# Patient Record
Sex: Male | Born: 1960 | Race: White | Hispanic: No | Marital: Married
Health system: Southern US, Community
[De-identification: ages and names within clinical notes are randomized; demographics above are authoritative.]

## PROBLEM LIST (undated history)

## (undated) DIAGNOSIS — I10 Essential (primary) hypertension: Secondary | ICD-10-CM

---

## 2010-08-06 ENCOUNTER — Emergency Department (HOSPITAL_COMMUNITY)
Admission: EM | Admit: 2010-08-06 | Discharge: 2010-08-06 | Discharge status: Against Medical Advice | Payer: BC Managed Care – PPO | Attending: Emergency Medicine | Admitting: Emergency Medicine

## 2010-08-06 ENCOUNTER — Encounter (HOSPITAL_COMMUNITY): Payer: Self-pay | Admitting: Radiology

## 2010-08-06 ENCOUNTER — Emergency Department (HOSPITAL_COMMUNITY): Payer: BC Managed Care – PPO

## 2010-08-06 DIAGNOSIS — R0989 Other specified symptoms and signs involving the circulatory and respiratory systems: Secondary | ICD-10-CM | POA: Insufficient documentation

## 2010-08-06 DIAGNOSIS — R0609 Other forms of dyspnea: Secondary | ICD-10-CM | POA: Insufficient documentation

## 2010-08-06 DIAGNOSIS — I498 Other specified cardiac arrhythmias: Secondary | ICD-10-CM | POA: Insufficient documentation

## 2010-08-06 DIAGNOSIS — R51 Headache: Secondary | ICD-10-CM | POA: Insufficient documentation

## 2010-08-06 DIAGNOSIS — R0602 Shortness of breath: Secondary | ICD-10-CM | POA: Insufficient documentation

## 2010-08-06 DIAGNOSIS — R079 Chest pain, unspecified: Secondary | ICD-10-CM | POA: Insufficient documentation

## 2010-08-06 DIAGNOSIS — R42 Dizziness and giddiness: Secondary | ICD-10-CM | POA: Insufficient documentation

## 2010-08-06 HISTORY — DX: Essential (primary) hypertension: I10

## 2010-08-06 LAB — CBC
HCT: 44.1 % (ref 39.0–52.0)
Hemoglobin: 15.5 g/dL (ref 13.0–17.0)
MCHC: 35.1 g/dL (ref 30.0–36.0)
MCV: 93 fL (ref 78.0–100.0)
RDW: 12 % (ref 11.5–15.5)

## 2010-08-06 LAB — COMPREHENSIVE METABOLIC PANEL
ALT: 40 U/L (ref 0–53)
AST: 21 U/L (ref 0–37)
Alkaline Phosphatase: 65 U/L (ref 39–117)
CO2: 30 mEq/L (ref 19–32)
Calcium: 9.1 mg/dL (ref 8.4–10.5)
GFR calc Af Amer: >60 mL/min (ref >60)
Glucose, Bld: 83 mg/dL (ref 70–99)
Potassium: 4 mEq/L (ref 3.5–5.1)
Sodium: 141 mEq/L (ref 135–145)
Total Protein: 7.5 g/dL (ref 6.0–8.3)

## 2010-08-06 LAB — POCT CARDIAC MARKERS
CKMB, poc: <1 ng/mL — ABNORMAL LOW (ref 1.0–8.0)
CKMB, poc: <1 ng/mL — ABNORMAL LOW (ref 1.0–8.0)
CKMB, poc: <1 ng/mL — ABNORMAL LOW (ref 1.0–8.0)
Myoglobin, poc: 51.7 ng/mL (ref 12–200)
Troponin i, poc: <0.05 ng/mL (ref 0.00–0.09)
Troponin i, poc: <0.05 ng/mL (ref 0.00–0.09)

## 2010-08-06 LAB — DIFFERENTIAL
Eosinophils Relative: 7 % — ABNORMAL HIGH (ref 0–5)
Lymphocytes Relative: 28 % (ref 12–46)
Lymphs Abs: 1.4 10*3/uL (ref 0.7–4.0)
Monocytes Absolute: 0.5 10*3/uL (ref 0.1–1.0)
Neutro Abs: 2.9 10*3/uL (ref 1.7–7.7)

## 2010-08-06 LAB — D-DIMER, QUANTITATIVE: D-Dimer, Quant: 1.54 ug/mL-FEU — ABNORMAL HIGH (ref 0.00–0.48)

## 2010-08-06 MED ORDER — IOHEXOL 300 MG/ML  SOLN
80.0000 mL | Freq: Once | INTRAMUSCULAR | Status: AC | PRN
  Administered 2010-08-06: 80 mL via INTRAVENOUS

## 2012-06-29 IMAGING — CR DG CHEST 2V
2 series · 2 of 2 positions shown · non-contrast
Comparison: None.

CLINICAL DATA: Chest pain, headaches, dizziness

CHEST - 2 VIEW

[w chest pa]
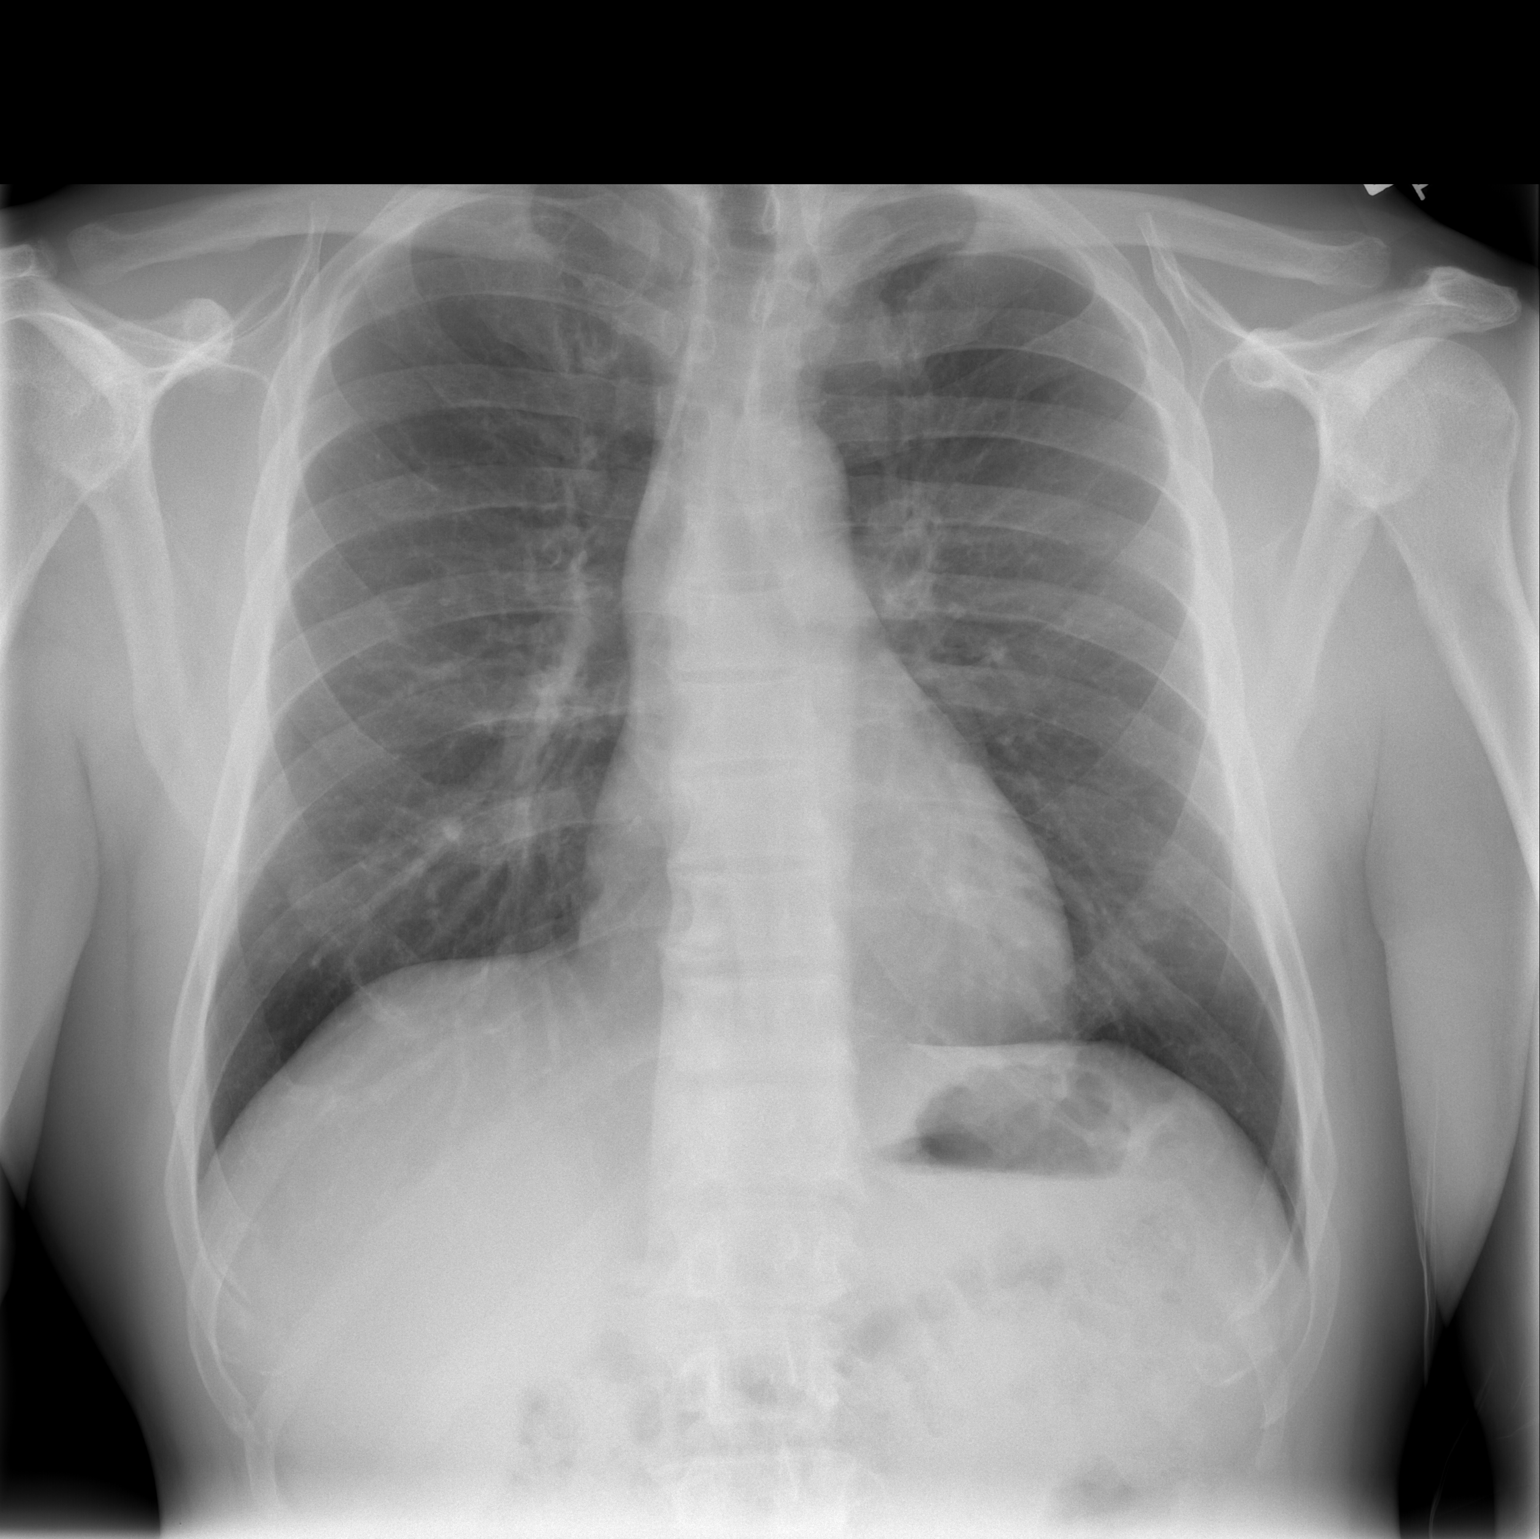

[w chest lat]
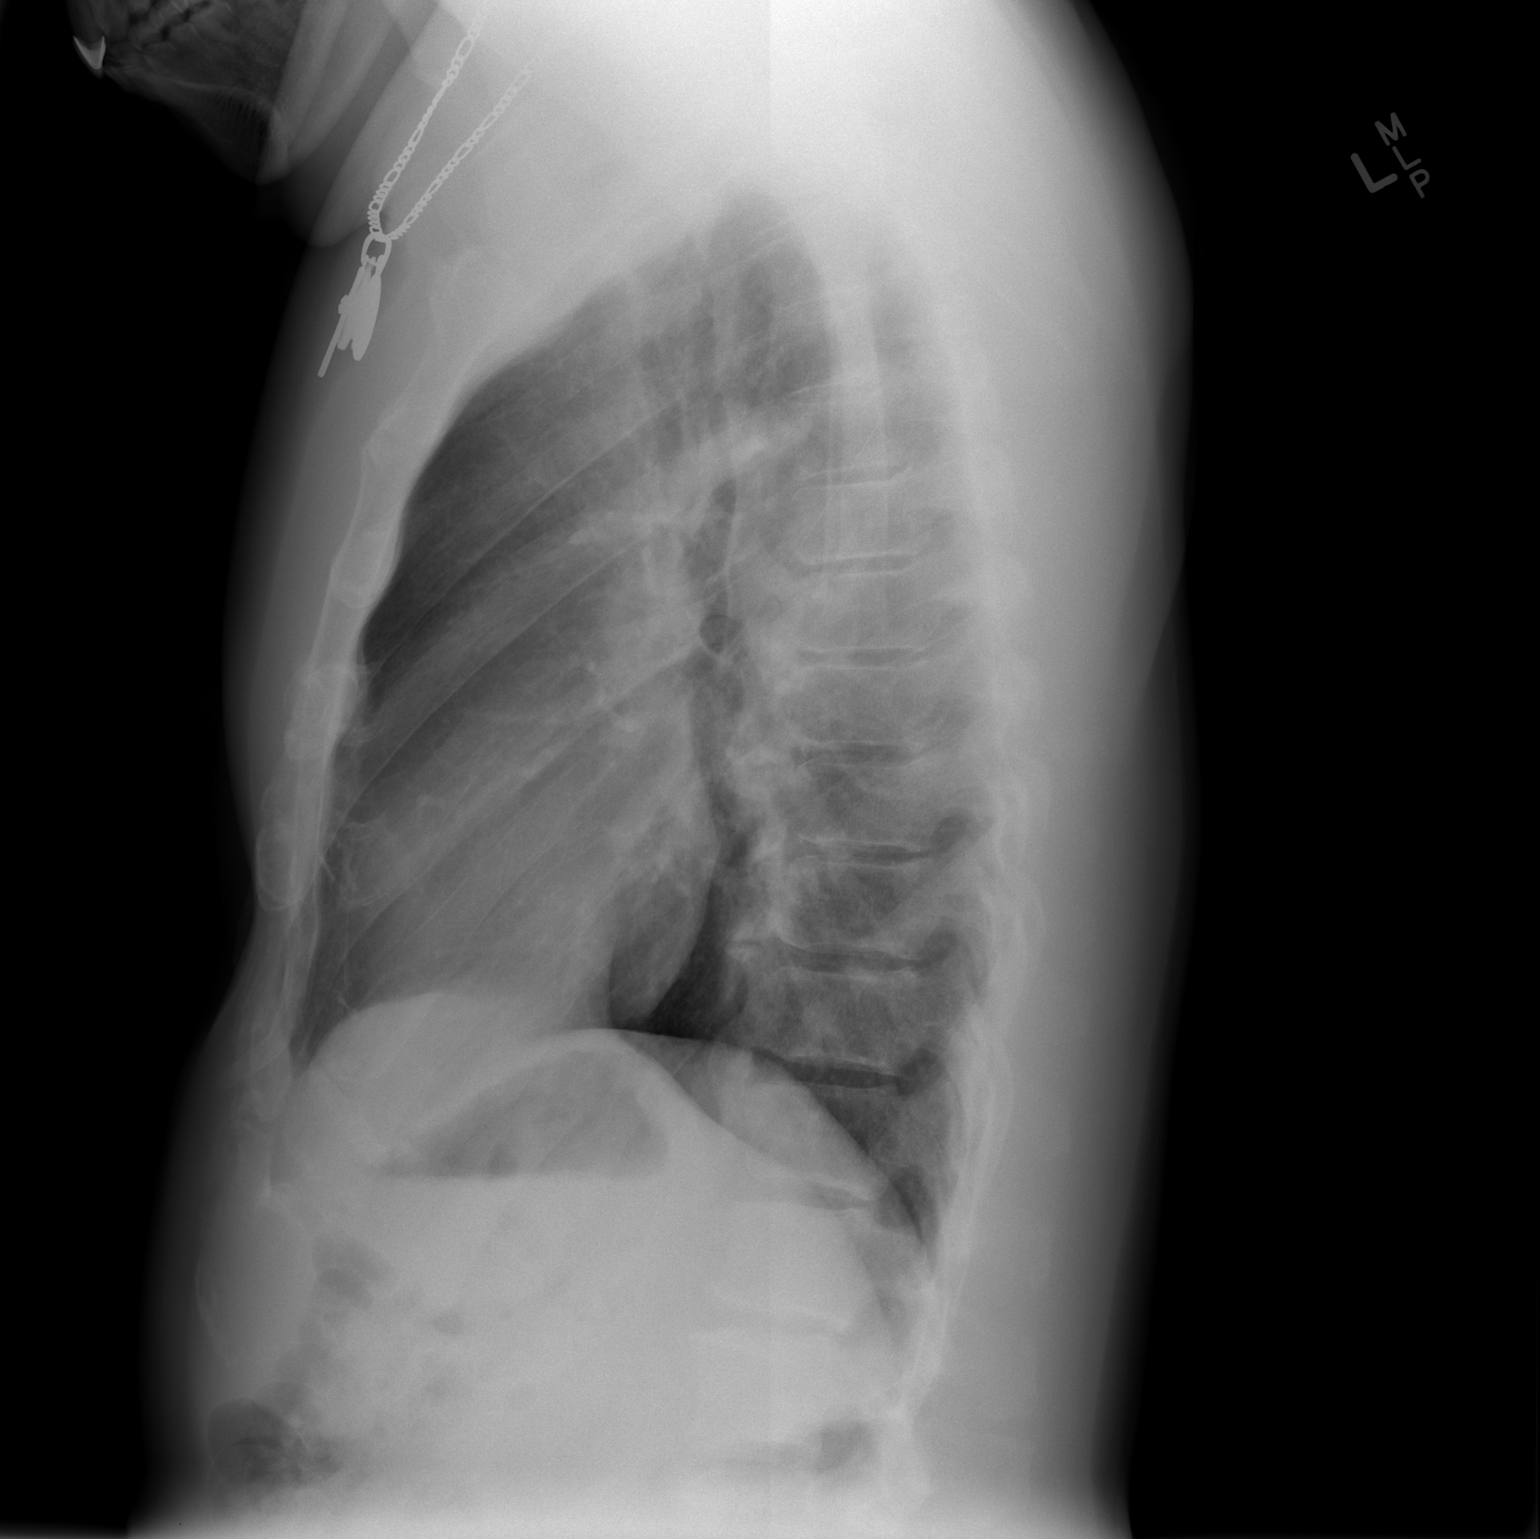

[2 of 2 positions shown; findings below may reference images not displayed]

FINDINGS: The lungs are clear.  Mediastinal contours appear normal.
The heart is within normal limits in size.  No bony abnormality is
seen.
IMPRESSION: No active lung disease.

## 2019-01-23 ENCOUNTER — Ambulatory Visit: Payer: Self-pay | Admitting: Orthopaedic Surgery

## 2019-01-29 ENCOUNTER — Ambulatory Visit: Payer: Self-pay | Admitting: Orthopaedic Surgery

## 2019-02-06 ENCOUNTER — Ambulatory Visit (INDEPENDENT_AMBULATORY_CARE_PROVIDER_SITE_OTHER): Payer: BC Managed Care – PPO | Admitting: Orthopaedic Surgery

## 2019-02-06 ENCOUNTER — Encounter: Payer: Self-pay | Admitting: Orthopaedic Surgery

## 2019-02-06 DIAGNOSIS — M1711 Unilateral primary osteoarthritis, right knee: Secondary | ICD-10-CM

## 2019-02-06 NOTE — Progress Notes (Signed)
Office Visit Note   Patient: Alexis Collins           Date of Birth: 09-Dec-1960           MRN: [id number] Visit Date: 02/06/2019              Requested by: No referring provider defined for this encounter. PCP: System, Pcp Not In   Assessment & Plan: Visit Diagnoses:  1. Tricompartment osteoarthritis of right knee     Plan: Due to the fact that it patient's knee is end-stage arthritis with decreased range of motion is impacting his activities of daily living recommend right total knee arthroplasty.  Risk benefits of surgery were discussed with the patient by Dr. Magnus Ivan and myself.  Knee model shown to the patient.  Postop protocol reviewed with patient.  Questions were encouraged and answered.  He like to proceed with surgery sometime late December early January.  He is given Domenica Reamer card and will call her when he wishes to schedule surgery.  Follow-Up Instructions: Return post op.   Orders:  No orders of the defined types were placed in this encounter.  No orders of the defined types were placed in this encounter.     Procedures: No procedures performed   Clinical Data: No additional findings.   Subjective: Chief Complaint  Patient presents with   Right Knee - Pain    HPI Hovanes is a 58 year old male comes in today for second opinion of his right knee.  Brings with him a CD dated 01/16/2019 contains 3 views of the right knee.  These show tricompartmental changes with bone-on-bone medial compartment moderate lateral compartmental changes severe patellofemoral changes with large osteophytes periarticular.  No acute fracture.  He states he injured his knee some 40 years ago playing basketball had knee arthroscopy was told he had something torn in the knee and that could only be fixed by harvesting and autograft.  He did not have any surgery for the torn ligament or tendon.  He is done well until the past 3 years and his knee pain has became worse.  He is taking  Aleve for it.  He has had no other treatment for this.  His pain is becoming worse to the point inspecting his IVs and putting on socks shoes due to the knee pain.  He states also a impacted his activity as far as exercise.  He states he is healthy states he has had occasional hypertension but takes no medications for this and that his blood pressure usually runs normal.  Review of Systems No fevers chills shortness of breath chest pain  Objective: Vital Signs: There were no vitals taken for this visit.  Physical Exam Constitutional:      Appearance: He is not ill-appearing or diaphoretic.  Pulmonary:     Effort: Pulmonary effort is normal.  Neurological:     Mental Status: He is alert and oriented to person, place, and time.  Psychiatric:        Mood and Affect: Mood normal.     Ortho Exam Right knee lacks approximately 5 degrees in full extension flexes only to 90 degrees both passively and actively.  No instability valgus varus stressing.  Positive varus deformity.  No significant abnormal warmth or erythema or effusion. Specialty Comments:  No specialty comments available.  Imaging: No results found.   PMFS History: Patient Active Problem List   Diagnosis Date Noted   Tricompartment osteoarthritis of right knee 02/06/2019   Past  Medical History:  Diagnosis Date   Hypertension     History reviewed. No pertinent family history.  History reviewed. No pertinent surgical history. Social History   Occupational History   Not on file  Tobacco Use   Smoking status: Not on file  Substance and Sexual Activity   Alcohol use: Not on file   Drug use: Not on file   Sexual activity: Not on file
